# Patient Record
Sex: Female | Born: 1975 | Race: White | Hispanic: No | Marital: Married | Smoking: Never smoker
Health system: Southern US, Community
[De-identification: ages and names within clinical notes are randomized; demographics above are authoritative.]

## PROBLEM LIST (undated history)

## (undated) DIAGNOSIS — K219 Gastro-esophageal reflux disease without esophagitis: Secondary | ICD-10-CM

## (undated) DIAGNOSIS — K229 Disease of esophagus, unspecified: Secondary | ICD-10-CM

---

## 2018-01-11 ENCOUNTER — Other Ambulatory Visit: Payer: Self-pay

## 2018-01-11 ENCOUNTER — Encounter: Payer: Self-pay | Admitting: Emergency Medicine

## 2018-01-11 ENCOUNTER — Emergency Department (INDEPENDENT_AMBULATORY_CARE_PROVIDER_SITE_OTHER)
Admission: EM | Admit: 2018-01-11 | Discharge: 2018-01-11 | Disposition: A | Discharge status: Home / Self Care | Payer: BLUE CROSS/BLUE SHIELD | Source: Home / Self Care | Attending: Emergency Medicine | Admitting: Emergency Medicine

## 2018-01-11 ENCOUNTER — Emergency Department (INDEPENDENT_AMBULATORY_CARE_PROVIDER_SITE_OTHER): Payer: BLUE CROSS/BLUE SHIELD

## 2018-01-11 DIAGNOSIS — J189 Pneumonia, unspecified organism: Secondary | ICD-10-CM

## 2018-01-11 DIAGNOSIS — R062 Wheezing: Secondary | ICD-10-CM

## 2018-01-11 DIAGNOSIS — J181 Lobar pneumonia, unspecified organism: Secondary | ICD-10-CM

## 2018-01-11 DIAGNOSIS — R05 Cough: Secondary | ICD-10-CM

## 2018-01-11 DIAGNOSIS — R0602 Shortness of breath: Secondary | ICD-10-CM

## 2018-01-11 DIAGNOSIS — R058 Other specified cough: Secondary | ICD-10-CM

## 2018-01-11 HISTORY — DX: Disease of esophagus, unspecified: K22.9

## 2018-01-11 HISTORY — DX: Gastro-esophageal reflux disease without esophagitis: K21.9

## 2018-01-11 MED ORDER — CEFTRIAXONE SODIUM 1 G IJ SOLR
1.0000 g | INTRAMUSCULAR | Status: AC
  Administered 2018-01-11: 1 g via INTRAMUSCULAR

## 2018-01-11 MED ORDER — CLARITHROMYCIN 500 MG PO TABS: ORAL_TABLET | ORAL | 0 refills | Status: AC

## 2018-01-11 MED ORDER — PREDNISONE 10 MG (21) PO TBPK: ORAL_TABLET | ORAL | 0 refills | Status: AC

## 2018-01-11 MED ORDER — IPRATROPIUM-ALBUTEROL 0.5-2.5 (3) MG/3ML IN SOLN
3.0000 mL | RESPIRATORY_TRACT | Status: AC
  Administered 2018-01-11: 3 mL via RESPIRATORY_TRACT

## 2018-01-11 MED ORDER — METHYLPREDNISOLONE ACETATE 80 MG/ML IJ SUSP
80.0000 mg | Freq: Once | INTRAMUSCULAR | Status: AC
  Administered 2018-01-11: 80 mg via INTRAMUSCULAR

## 2018-01-11 NOTE — ED Provider Notes (Signed)
Here with husband. Chief complaint: Cough  HPI: 4-5 days of malaise and progressive cough, productive of yellow-green sputum.  Some fever but no definite chills or sweats. No chest pain.  Had wheezing, used albuterol MDI which she had at home and that did not help significantly. Has mild shortness of breath and wheezing with exertion.  No exertional chest pain. Tried OTC cough med which helps somewhat.   Review of systems: Except for HPI, remainder of review of systems negative. Last menstrual period 12/26/2017.  She denies chance of pregnancy.   Past medical history: She states she has some type of chronic esophageal problem, she is scheduled for upper endoscopy 01/13/18.  Past Surgical History:  Procedure Laterality Date  . CESAREAN SECTION      OB History   None   LMP 12/26/2017   Home Medications    Prior to Admission medications   Medication Sig Start Date End Date Taking? Authorizing Provider  clarithromycin (BIAXIN) 500 MG tablet Take 1 twice a day for 10 days.  Take with food 01/11/18   Lajean Manes, MD  predniSONE (STERAPRED UNI-PAK 21 TAB) 10 MG (21) TBPK tablet Take tapering dosage over 6 days as directed.  Take with food 01/11/18   Lajean Manes, MD    Family History No family history on file. Family history positive for heart disease and bladder cancer in father, hypertension in mother. Social History Social History   Tobacco Use  . Smoking status: Never Smoker  . Smokeless tobacco: Never Used  Substance Use Topics  . Alcohol use: Never    Frequency: Never  . Drug use: Not on file     Allergies   Codeine and Sulfa antibiotics   Review of Systems Review of Systems  All other systems reviewed and are negative.  See also HPI. Otherwise, no new symptoms, review of systems otherwise negative Physical Exam Triage Vital Signs ED Triage Vitals  Enc Vitals Group     BP 01/11/18 1502 107/64     Pulse Rate 01/11/18 1502 79     Resp 01/11/18 1502 18    Temp 01/11/18 1502 98.8 F (37.1 C)     Temp Source 01/11/18 1502 Oral     SpO2 01/11/18 1502 97 %     Weight 01/11/18 1503 188 lb (85.3 kg)     Height 01/11/18 1503 5\' 8"  (1.727 m)     Head Circumference --      Peak Flow --      Pain Score 01/11/18 1502 0     Pain Loc --      Pain Edu? --      Excl. in GC? --    No data found.  Updated Vital Signs BP 107/64 (BP Location: Right Arm)   Pulse 79   Temp 98.8 F (37.1 C) (Oral)   Resp 18   Ht 5\' 8"  (1.727 m)   Wt 188 lb (85.3 kg)   LMP 12/26/2017 (Exact Date)   SpO2 97%   BMI 28.59 kg/m   Visual Acuity Right Eye Distance:   Left Eye Distance:   Bilateral Distance:    Right Eye Near:   Left Eye Near:    Bilateral Near:     Physical Exam  Constitutional: She is oriented to person, place, and time. She appears well-developed and well-nourished. No distress.  Appears fatigued.  No acute distress, although hacking nonproductive cough noted.  Oxygen saturation 97% on room air  HENT:  Head: Normocephalic and atraumatic.  Right Ear: Tympanic membrane normal.  Left Ear: Tympanic membrane normal.  Nose: Nose normal.  Mouth/Throat: Oropharynx is clear and moist. No oropharyngeal exudate.  Eyes: Right eye exhibits no discharge. Left eye exhibits no discharge. No scleral icterus.  Neck: Neck supple.  Cardiovascular: Normal rate, regular rhythm and normal heart sounds.  Pulmonary/Chest: No respiratory distress. She has wheezes. She has rhonchi. She has no rales.  Hacking cough noted. Minimal bibasilar crackles, which clear after coughing.  Mild right mid lung crackles. She has diffuse rhonchi.  Mild to moderate late expiratory wheezes. Breath sounds equal bilaterally. Oxygen saturation 97% room air   Abdominal: She exhibits no distension.  Lymphadenopathy:    She has no cervical adenopathy.  Neurological: She is alert and oriented to person, place, and time.  Skin: Skin is warm and dry. Capillary refill takes less than 2  seconds. No rash noted.  Psychiatric: She has a normal mood and affect.  Nursing note and vitals reviewed.    UC Treatments / Results  Labs (all labs ordered are listed, but only abnormal results are displayed) Labs Reviewed - No data to display  EKG None Radiology Dg Chest 2 View  Result Date: 01/11/2018 CLINICAL DATA:  Productive cough and shortness of breath for 1 week. EXAM: CHEST - 2 VIEW COMPARISON:  None. FINDINGS: RIGHT middle lobe airspace disease is compatible with pneumonia. The cardiomediastinal silhouette is unremarkable. There is no evidence of pulmonary edema, suspicious pulmonary nodule/mass, pleural effusion, or pneumothorax. No acute bony abnormalities are identified. IMPRESSION: RIGHT middle lobe airspace disease compatible with pneumonia. Electronically Signed   By: Harmon Pier M.D.   On: 01/11/2018 15:36    Procedures Procedures (including critical care time)  Medications Ordered in UC Medications  methylPREDNISolone acetate (DEPO-MEDROL) injection 80 mg (80 mg Intramuscular Given 01/11/18 1524)  ipratropium-albuterol (DUONEB) 0.5-2.5 (3) MG/3ML nebulizer solution 3 mL (3 mLs Nebulization Given 01/11/18 1541)  cefTRIAXone (ROCEPHIN) injection 1 g (1 g Intramuscular Given 01/11/18 1620)     Initial Impression / Assessment and Plan / UC Course  I have reviewed the triage vital signs and the nursing notes.  Pertinent labs & imaging results that were available during my care of the patient were reviewed by me and considered in my medical decision making (see chart for details).     Reviewed with patient and husband, chest x-ray shows right middle lobe pneumonia. Treatment options discussed, as well as risks, benefits, alternatives. They voiced understanding and agreement with the following plans:  DuoNeb treatment given.   wheezing somewhat improved after DuoNeb treatment. Depo-Medrol 80 mg IM stat. Continue albuterol HHN every 4 to 6 hours at home as needed  for wheezing.  After consideration of antibiotic options, I prescribed Biaxin 500 twice daily for 10 days, for coverage of bacterial and atypical causes. Prednisone 10 mg - 6-day Dosepak.   Final Clinical Impressions(s) / UC Diagnoses   Final diagnoses:  Wheezing  Cough productive of purulent sputum  Pneumonia of right middle lobe due to infectious organism [facility])   An After Visit Summary was printed and given to the patient. You have a right sided "walking pneumonia". Treating you with shot of Rocephin antibiotic, and Biaxin twice a day for 10 days. Already gave you a shot of Depo-Medrol, which is a cortisone shot to help wheezing.  Here in urgent care, the nebulizer treatment helped your wheezing . Prescription sent to your pharmacy: Biaxin, which is an antibiotic Prednisone, which will help your wheezing. Okay  to use the Dimetapp cough and cold which is helping. Also, drink plenty of fluids.  Tylenol or ibuprofen as needed for fever or pain. Follow-up with your doctor in 7-10 days for recheck, but if any severe or worsening symptoms, go to emergency room immediately  Precautions discussed. Red flags discussed. Questions invited and answered. They voiced understanding and agreement.  ED Discharge Orders        Ordered    clarithromycin (BIAXIN) 500 MG tablet     01/11/18 1621    predniSONE (STERAPRED UNI-PAK 21 TAB) 10 MG (21) TBPK tablet     01/11/18 1621       Controlled Substance Prescriptions [place] Controlled Substance Registry consulted? Not Applicable   Lajean ManesMassey, David, MD 01/11/18 2235

## 2018-01-11 NOTE — ED Triage Notes (Signed)
Patient swallowed pill 2 weeks ago that lodged in her esophagus; she is scheduled for endoscopy 01/13/18. States 2 days ago began coughing and it is now productive; had fever 2 days ago but none since.

## 2018-01-11 NOTE — Discharge Instructions (Signed)
You have a right sided "walking pneumonia". Treating you with shot of Rocephin antibiotic, and Biaxin twice a day for 10 days. Already gave you a shot of Depo-Medrol, which is a cortisone shot to help wheezing.  Here in urgent care, the nebulizer treatment helped your wheezing . Prescription sent to your pharmacy: Biaxin, which is an antibiotic Prednisone, which will help your wheezing. Okay to use the Dimetapp cough and cold which is helping. Also, drink plenty of fluids.  Tylenol or ibuprofen as needed for fever or pain. Follow-up with your doctor in 7-10 days for recheck, but if any severe or worsening symptoms, go to emergency room immediately

## 2019-03-07 IMAGING — DX DG CHEST 2V
2 series · 2 of 2 positions shown · non-contrast
Comparison: None.

CLINICAL DATA: Productive cough and shortness of breath for 1 week.

EXAM:
CHEST - 2 VIEW

[chest pa]
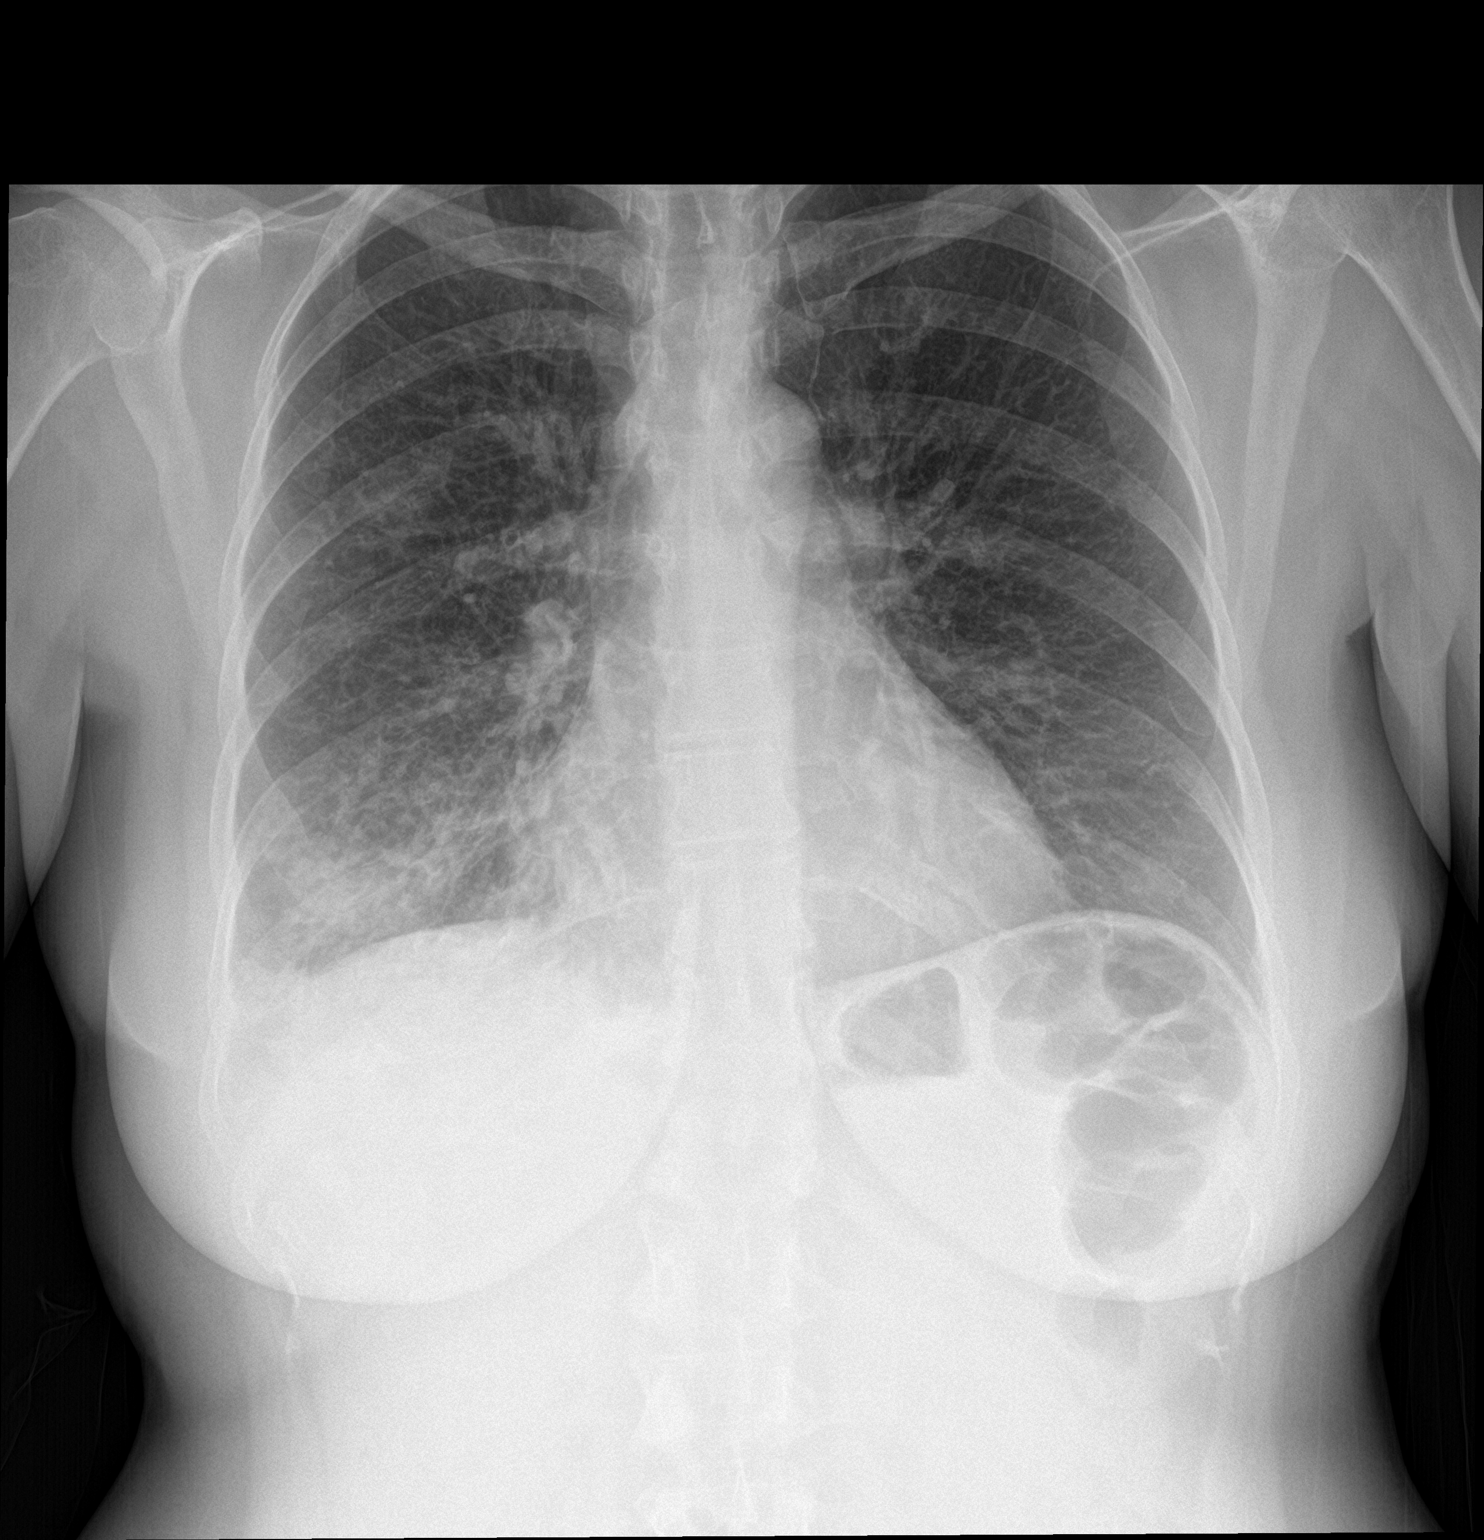

[chest lat]
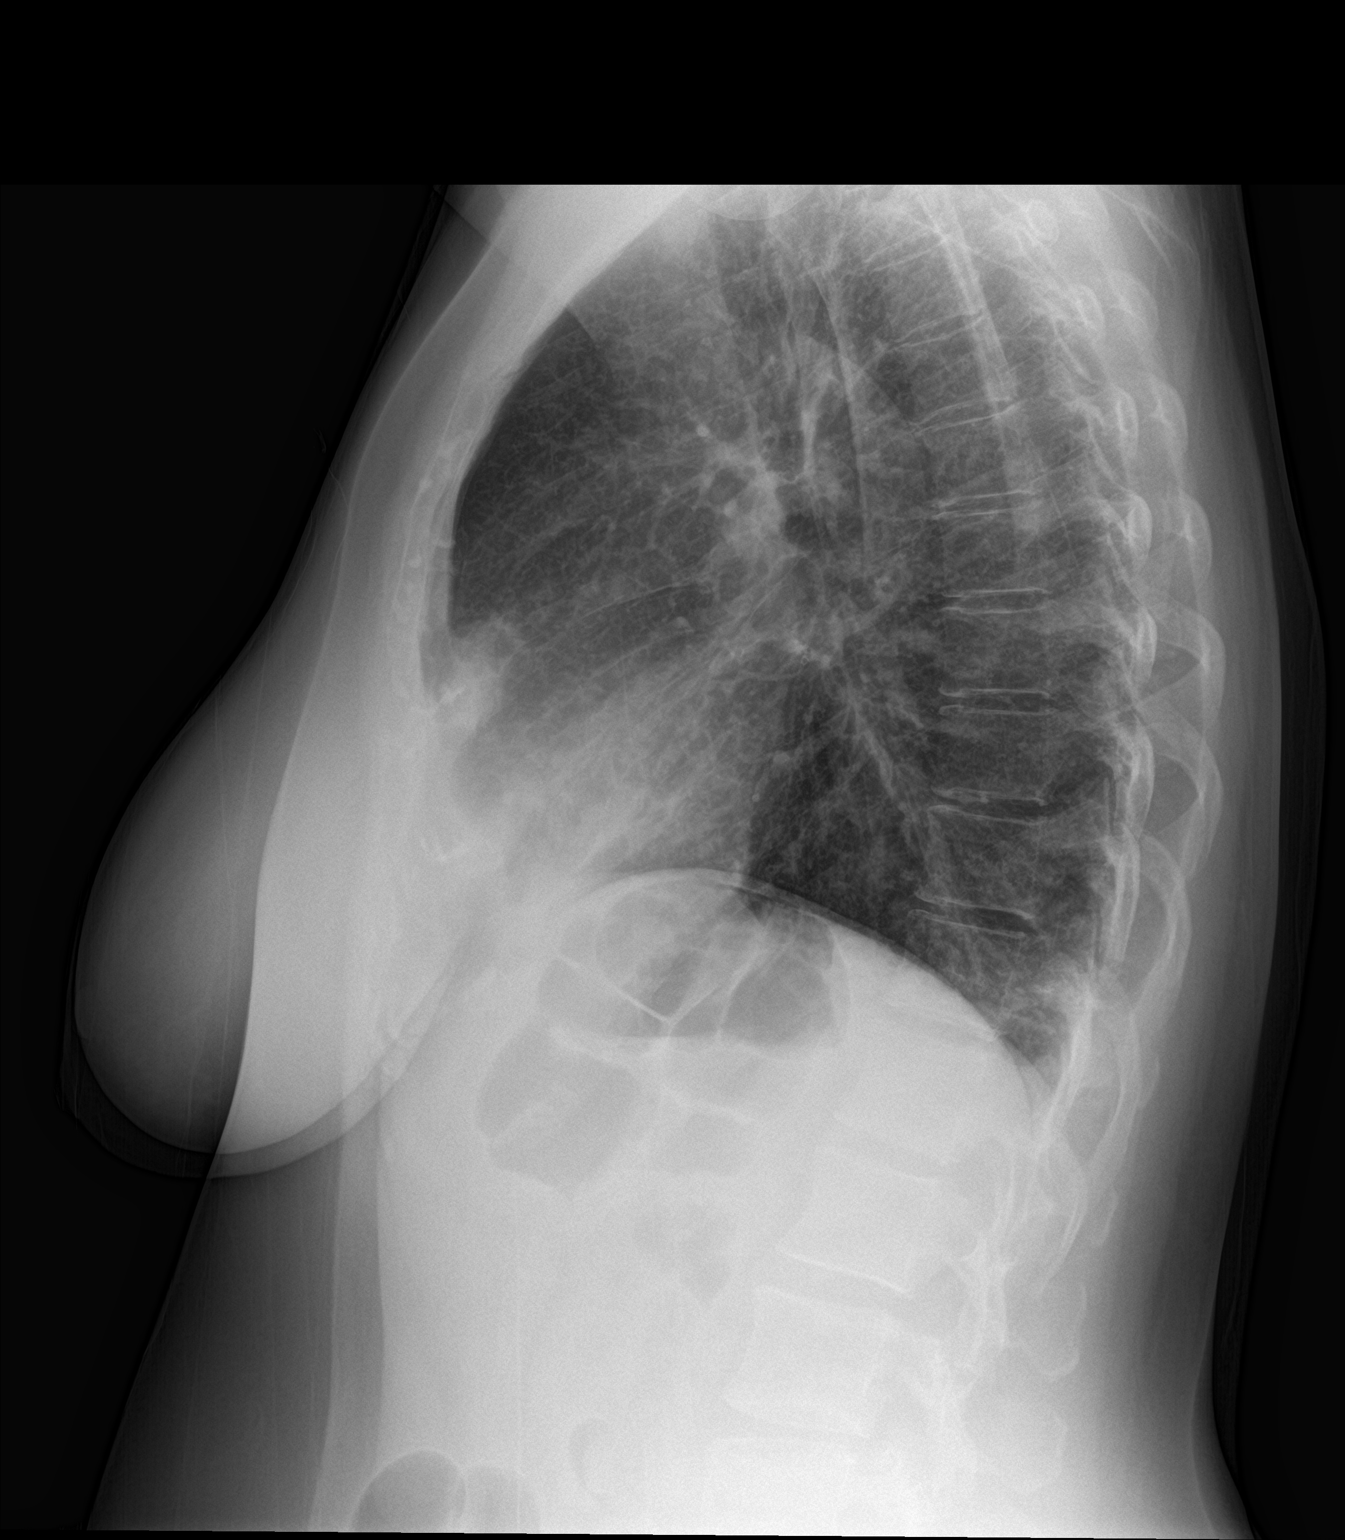

[2 of 2 positions shown; findings below may reference images not displayed]

FINDINGS: RIGHT middle lobe airspace disease is compatible with pneumonia.

The cardiomediastinal silhouette is unremarkable.

There is no evidence of pulmonary edema, suspicious pulmonary
nodule/mass, pleural effusion, or pneumothorax.

No acute bony abnormalities are identified.
IMPRESSION: RIGHT middle lobe airspace disease compatible with pneumonia.

## 2020-12-23 ENCOUNTER — Emergency Department
Admission: EM | Admit: 2020-12-23 | Discharge: 2020-12-23 | Discharge status: Against Medical Advice | Payer: BLUE CROSS/BLUE SHIELD | Source: Home / Self Care

## 2020-12-23 ENCOUNTER — Other Ambulatory Visit: Payer: Self-pay
